# Patient Record
Sex: Female | Born: 1955 | Marital: Married
Health system: Western US, Academic
[De-identification: ages and names within clinical notes are randomized; demographics above are authoritative.]

---

## 2006-08-11 ENCOUNTER — Ambulatory Visit (HOSPITAL_BASED_OUTPATIENT_CLINIC_OR_DEPARTMENT_OTHER): Payer: Self-pay

## 2010-09-19 NOTE — Procedures (Signed)
REPORT TYPE: Outpatient Electroretinogram (ERG)    EP# [id number]    [facility] NAME: RN    Dictating Physician: Sedalia Muta. Genice Rouge, M.D.    Requesting Provider: Daine Gip, M.D.    Study Date: 08/11/2006     ELECTRORETINOGRAM    HISTORY: This is a 54 year old female with progressive scotopic vision loss  over the past 6-7 months.    Visual Acuity: Left eye: 20/25, Right eye: 20/25    A Nicolet Bravo was employed in conjunction with corneal electrodes  referenced to FpZ. Testing was performed with the pupils dilated. A  [place]anzfield bowl was employed.    REPORT:  Right eye: Absent responses under photopic and scotopic conditions.    Left eye: Absent responses under photopic and scotopic conditions.    CLINICAL INTERPRETATION: The electroretinography is extinguished in both  eyes.    COMMENTS: An extinguished ERG is classically seen in the generalized  degenerations of the retina. Such studies can also be seen with ophthalmic  artery occlusion, chorioretinitis metallosis, retinal detachment and drug  toxicity. Clinical correlation is required.    Sedalia Muta Genice Rouge, M.D., Ph.D./Professor of Neurosciences          Signature Derived From Controlled Access Password  Sedalia Muta. Genice Rouge, M.D. 08/25/2006 12:36 P          DD: 08/12/2006 DT: 08/13/2006 12:08 P DocNo.: [id number]  EST/txt      cc: Daine Gip   Fax Recipient   [address]   [place] [address]

## 2019-12-22 NOTE — Progress Notes (Signed)
I reviewed Holly Dyer today on 12/25/2019 who is a 64 year old year old female patient. With Spanish translation provided by certified staff.    HPI     Referred by Dr. Grier Rocher    Presenting complaint:  Night vision/Dark problems and Peripheral field constriction    History of Presenting complaint:  Noticed bumping into items when she was younger.  When wearing sunglasses had a problem with seeing.    Will have cataract surgery on the left eye in February.    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 Does cooking, cleaning but not shopping. Can not travel outside the house. Interested in white stick training.Reading well.       Having cataract surgery OS in February   H/O RP OU 2007  DM2 2013 A1C  121    Last edited by Elberta Fortis, MD on 12/25/2019  2:53 PM. (History)        Visual Acuity (ETDRS)       Right Left    Dist sc 20/63 20/63        Tonometry (i[facility], 1:18 PM)       Right Left    Pressure 10 9        Main Ophthalmology Exam     External Exam       Right Left    External Normal Normal          Slit Lamp Exam       Right Left    Lids/Lashes Normal for Age Normal for Age    Conjunctiva/Sclera White and quiet White and quiet    Cornea Clear Clear    Anterior Chamber Deep and quiet Deep and quiet    Iris Round, no rubeosis Round, no rubeosis    Lens Clear Clear    Vitreous Normal Normal          Fundus Exam       Right Left    Disc Healthy contour and size Healthy contour and size    Macula Normal contour and reflex for age Normal contour and reflex for age    Vessels Normal Normal    Periphery Flat and  attached 360 degrees Flat and attached 360 degrees              OCT, Retina - OU - Both Eyes -[place]           Interpretation  Right Eye  The finding is: Normal without fluid. Findings include PVD.     Left Eye  The finding is: Normal without fluid. Findings include PVD.     Interval Change  Right Eye  Interval change is: Initial.     Left Eye  Interval change is: Initial.     Plan  Right Eye  Plan: Observe.     Left Eye  Plan: Observe.     Notes  Outer retinal loss outside central macula. No cystoid macular edema  .             Fundus Photos - OU - Both  Eyes -Optos           Color Fundus Interpretation  Right Eye  Color progression has not been established as this is the first image. Disc findings include normal observations. Vessel findings include attenuated vessels. Periphery findings include bone spicules.     Left Eye  Color progression has not been established as this is the first image. Disc findings include normal observations. Vessel findings include normal observations. Periphery findings include bone spicules.     Autofluorescence Interpretation  Right Eye  Findings include Hypoautofluorescence.     Left Eye  Findings include Hypoautofluorescence.     Notes  Unless otherwise specified above, the interpretation can be found in today's or the subsequent clinic encounter note. For disc photos, the images were acquired to establish a baseline for future examinations.                 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  2. Cataracts bilaterally - for operation soon, to watch for post operative cystoid macular edema.    Follow up: 3 months for review of genetic testing and octopus visual field      If you have any questions, please do not hesitate to contact me.      Sincerely,      Judd Gaudier  MBBS MRCP([place]) Kathleen Argue PhD  Assistant Professor  Retinal Division [place]     12/25/2019 2:49 PM

## 2019-12-25 ENCOUNTER — Encounter (INDEPENDENT_AMBULATORY_CARE_PROVIDER_SITE_OTHER): Payer: Self-pay

## 2019-12-25 ENCOUNTER — Ambulatory Visit (INDEPENDENT_AMBULATORY_CARE_PROVIDER_SITE_OTHER): Payer: 141

## 2019-12-25 DIAGNOSIS — H3552 Pigmentary retinal dystrophy: Secondary | ICD-10-CM

## 2019-12-25 DIAGNOSIS — Z135 Encounter for screening for eye and ear disorders: Secondary | ICD-10-CM

## 2019-12-25 DIAGNOSIS — H269 Unspecified cataract: Secondary | ICD-10-CM

## 2019-12-25 MED ORDER — METFORMIN HCL 1000 MG OR TABS: 1000.0000 mg | ORAL_TABLET | Freq: Two times a day (BID) | ORAL | Status: AC

## 2019-12-25 MED ORDER — GLIPIZIDE 10 MG OR TABS: 10.0000 mg | ORAL_TABLET | Freq: Two times a day (BID) | ORAL | Status: AC

## 2020-01-08 ENCOUNTER — Encounter (INDEPENDENT_AMBULATORY_CARE_PROVIDER_SITE_OTHER): Payer: Self-pay

## 2020-03-26 ENCOUNTER — Ambulatory Visit (INDEPENDENT_AMBULATORY_CARE_PROVIDER_SITE_OTHER): Payer: No Typology Code available for payment source | Admitting: Cornea and External Diseases Specialist

## 2020-04-09 ENCOUNTER — Ambulatory Visit (INDEPENDENT_AMBULATORY_CARE_PROVIDER_SITE_OTHER): Payer: 141 | Admitting: Cornea and External Diseases Specialist

## 2020-04-09 DIAGNOSIS — H3552 Pigmentary retinal dystrophy: Secondary | ICD-10-CM

## 2020-04-09 DIAGNOSIS — H35353 Cystoid macular degeneration, bilateral: Secondary | ICD-10-CM

## 2020-04-09 DIAGNOSIS — Z135 Encounter for screening for eye and ear disorders: Secondary | ICD-10-CM

## 2020-04-09 NOTE — Progress Notes (Signed)
I reviewed Holly Dyer today on 04/09/2020 who is a 64 year old year old female patient.    HPI     Spansih nterpreter ID: [id number]    Patient notices no change in vision    S/p CEIOL OD (03/26/20)    OS (01/23/2020)     VA is getting better per pt.    Gtts:     Prednisolone drops 4 times a day right eye   Diclofenac 4 times a day right eye    Last edited by Laverna Peace, MD on 04/09/2020 12:58 PM. (History)        Visual Acuity (ETDRS)       Right Left    Dist sc 20/40-2 20/40-1    Dist ph sc Ni 20/40        Tonometry ([facility], 11:55 AM)       Right Left    Pressure 11 10        Main Ophthalmology Exam     External Exam       Right Left    External Normal Normal          Slit Lamp Exam       Right Left    Lids/Lashes Normal for Age Normal for Age    Conjunctiva/Sclera White and quiet White and quiet    Cornea Clear Clear    Anterior Chamber Deep and quiet Deep and quiet    Iris Round, no rubeosis Round, no rubeosis    Lens Posterior chamber intraocular lens Posterior chamber intraocular lens    Vitreous Normal Normal          Fundus Exam       Right Left    Disc Healthy contour and size Healthy contour and size    Macula Normal contour and reflex for age Normal contour and reflex for age    Vessels Normal Normal    Periphery Bone spicule pigmentation Bone spicule pigmentation              Automated Visual Field, Extended - OU - Both Eyes -           Right Eye  Threshold was G. Reliability was good.     Left Eye  Threshold was G. Reliability was good.     Notes  Bilateral very constricted visual fields.               Assessment/Plan  1. Retinitis pigmentosa with trace cystoid macular edema - continue drops for 4 weeks right eye, continue to observe the left eye.Genetic counseling  2. Recent bilateral cataract surgery - continue drops and keepo folow up with cataract surgeon    Follow up: 3 months with microperimetry    If you have any questions, please do not hesitate to contact me.       Sincerely,      Judd Gaudier  MBBS MRCP([place]) Kathleen Argue PhD  Assistant Professor  Retinal Division [place]     04/09/2020 1:10 PM

## 2020-07-09 ENCOUNTER — Ambulatory Visit (INDEPENDENT_AMBULATORY_CARE_PROVIDER_SITE_OTHER): Payer: 141 | Admitting: Cornea and External Diseases Specialist

## 2020-07-09 DIAGNOSIS — H35353 Cystoid macular degeneration, bilateral: Secondary | ICD-10-CM

## 2020-07-09 DIAGNOSIS — H3552 Pigmentary retinal dystrophy: Secondary | ICD-10-CM

## 2020-07-09 NOTE — Progress Notes (Addendum)
I reviewed Holly Dyer today on 07/09/2020 who is a 64 year old year old female patient.    HPI     Eye Exam     In both eyes.  Onset was unknown.              Comments     Patient reports doing well and no change in vision    POH: Retinitis pigmentosa, PC IOL bilaterally     Meds:  Tears prn ou              Last edited by Laverna Peace, MD on 07/09/2020 11:12 AM. (History)        Visual Acuity (Snellen - Linear)       Right Left    Dist sc 20/32 -1 20/32 -1        Tonometry ([facility], 10:08 AM)       Right Left    Pressure 9 10        Main Ophthalmology Exam     External Exam       Right Left    External Normal Normal          Slit Lamp Exam       Right Left    Lids/Lashes Normal for Age Normal for Age    Conjunctiva/Sclera White and quiet White and quiet    Cornea Clear Clear    Anterior Chamber Deep and quiet Deep and quiet    Iris Round, no rubeosis Round, no rubeosis    Lens Posterior chamber intraocular lens Posterior chamber intraocular lens    Vitreous Normal Normal          Fundus Exam       Right Left    Disc Healthy contour and size Healthy contour and size    Macula Normal contour and reflex for age Normal contour and reflex for age    Vessels Normal Normal    Periphery Bone spicule pigmentation Bone spicule pigmentation              OCT, Retina - OU - Both Eyes -           Interpretation  Right Eye  The finding is: Normal without fluid. Findings include PVD. Macular Edema.     Left Eye  The finding is: Normal without fluid. Findings include PVD. Macular Edema.     Interval Change  Right Eye  Interval change is: Stable.     Left Eye  Interval change is: Stable.     Plan  Right Eye  Plan: Observe.     Left Eye  Plan: Observe.     Notes  Unless otherwise specified above, the interpretation can be found in todays or the subsequent clinic encounter note. For disc photos, the images were acquired to establish a baseline for future examinations.             Fundus Photos - OU - Both Eyes -Optos            Color Fundus Interpretation  Right Eye  Disc findings include normal observations. Vessel findings include attenuated vessels. Periphery findings include bone spicules.     Left Eye  Disc findings include normal observations. Vessel findings include normal observations. Periphery findings include bone spicules.     Autofluorescence Interpretation  Right Eye  Findings include Hypoautofluorescence.     Left Eye  Findings include Hypoautofluorescence.     Notes  Unless otherwise specified above, the interpretation can be  found in todays or the subsequent clinic encounter note. For disc photos, the images were acquired to establish a baseline for future examinations.               Microperimetry : Bilateral loss of sensitivity in the peripheral macula    Assessment/Plan  1. RETINITIS PIGMENTOSA  With cystoid macular edema non center involving - help with transport and [facility] today as limited to house at present, stable cystoid macular edema, microperimetry today as a baseline for progression    Follow up: 6 months    If you have any questions, please do not hesitate to contact me.      Sincerely,      Judd Gaudier  MBBS MRCP([place]) Kathleen Argue PhD  Assistant Professor  Retinal Division [place]     07/09/2020 11:13 AM

## 2020-07-09 NOTE — Addendum Note (Signed)
Addended by: Kara Dies, Nechelle Petrizzo on: 07/09/2020 11:20 AM     Modules accepted: Orders

## 2020-07-09 NOTE — Addendum Note (Signed)
Addended by: Glean Salvo on: 07/09/2020 11:56 AM     Modules accepted: Orders

## 2021-01-07 ENCOUNTER — Ambulatory Visit (INDEPENDENT_AMBULATORY_CARE_PROVIDER_SITE_OTHER): Payer: 141 | Admitting: Cornea and External Diseases Specialist

## 2021-01-07 DIAGNOSIS — H3552 Pigmentary retinal dystrophy: Secondary | ICD-10-CM

## 2021-01-07 DIAGNOSIS — H04123 Dry eye syndrome of bilateral lacrimal glands: Secondary | ICD-10-CM

## 2021-01-07 NOTE — Progress Notes (Signed)
I reviewed Holly Dyer today on 07/09/2020 who is a 65 year old year old female patient.    HPI     Change to ocular history:  No change in her night vision or peripheral field     Smoking History: non-smoker    Driving history: No    Updates to Occupational/Educational needs: no she no help with her daily activities     Updates to Social assistance and needs: no           Last edited by Jimmy Footman on 01/07/2021 11:59 AM. (History)        Visual Acuity (ETDRS)       Right Left    Dist sc 20/25 -2 20/25 -2        Tonometry ([facility], 11:05 AM)       Right Left    Pressure 10 10        Main Ophthalmology Exam     External Exam       Right Left    External Normal Normal          Slit Lamp Exam       Right Left    Lids/Lashes Normal for Age Normal for Age    Conjunctiva/Sclera White and quiet White and quiet    Cornea Clear Clear    Anterior Chamber Deep and quiet Deep and quiet    Iris Round, no rubeosis Round, no rubeosis    Lens Posterior chamber intraocular lens Posterior chamber intraocular lens    Vitreous Normal Normal          Fundus Exam       Right Left    Disc Healthy contour and size Healthy contour and size    Macula Normal contour and reflex for age Normal contour and reflex for age    Vessels Normal Normal    Periphery Bone spicule pigmentation Bone spicule pigmentation              OCT, Retina - OU - Both Eyes -           Interpretation  Right Eye  The finding is: Normal without fluid. Findings include PVD. Negative for Macular Edema.     Left Eye  The finding is: Normal without fluid. Findings include PVD. Negative for Macular Edema.     Interval Change  Right Eye  Interval change is: Better.     Left Eye  Interval change is: Better.     Plan  Right Eye  Plan: Observe.     Left Eye  Plan: Observe.            Fundus Photos - OU - Both Eyes -           Color Fundus Interpretation  Right Eye  Disc findings include normal observations. Vessel findings include attenuated vessels. Periphery findings  include bone spicules.     Left Eye  Disc findings include normal observations. Vessel findings include normal observations. Periphery findings include bone spicules.     Autofluorescence Interpretation  Right Eye  Findings include Hypoautofluorescence.     Left Eye  Findings include Hypoautofluorescence.              Microperimetry : Bilateral loss of sensitivity in the peripheral macula    Assessment/Plan  1. RETINITIS PIGMENTOSA  - resolved cystoid macular edema, no requirements for social assistance at present according to patient, although do notice poor mobility in clinic and may be a candidate  for stick training  2. Dry eyes - continue artifical tears    Follow up: 12 months    If you have any questions, please do not hesitate to contact me.      Sincerely,      Judd Gaudier  MBBS MRCP([place]) MRCSEd FRCOphth PhD  Assistant Professor  Retinal Division [place]

## 2023-05-04 IMAGING — CT Tc [place] ([place], [place]
1 of 2 series · 14 of 32 positions shown, 20 images · non-contrast
Comparison: none

[Series 3: vol pm s/c · axial · 0.56mm/px · z∈[-26,+205]mm · 14 of 429 slices shown, 20 images]
[im 29/429  soft-tissue]
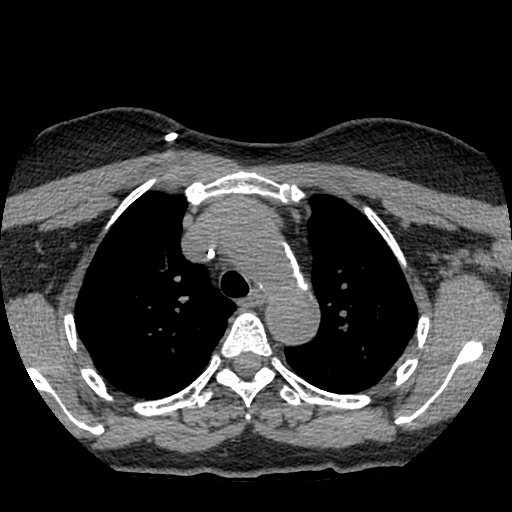
[im 29/429  bone]
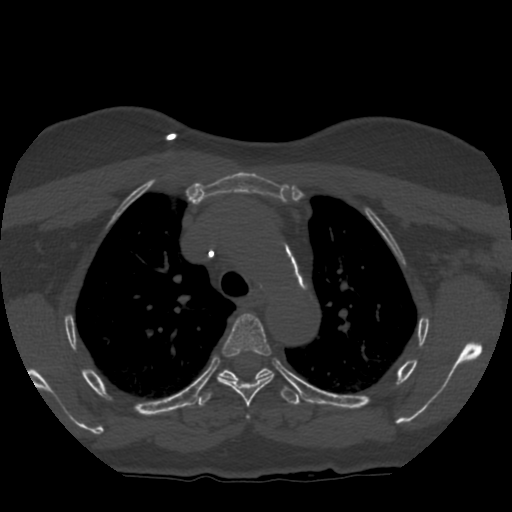
[im 58/429  soft-tissue]
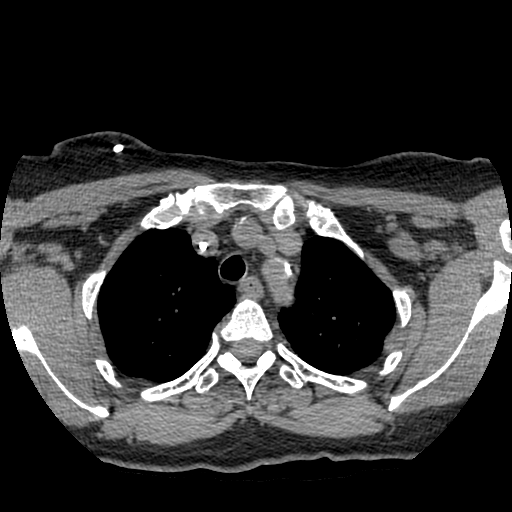
[im 86/429  soft-tissue]
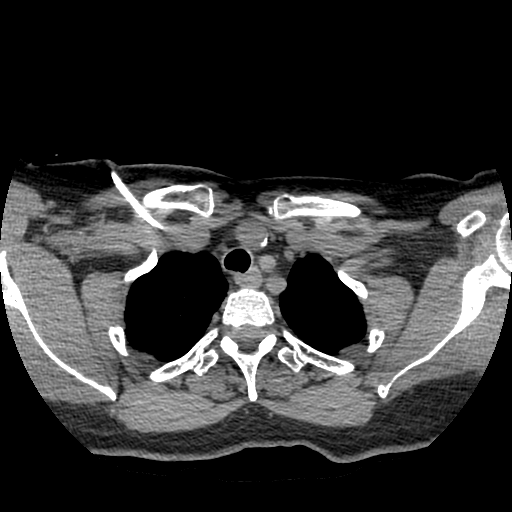
[im 115/429  soft-tissue]
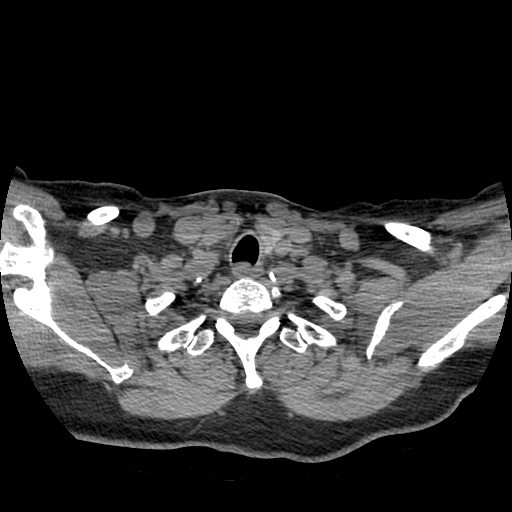
[im 143/429  soft-tissue]
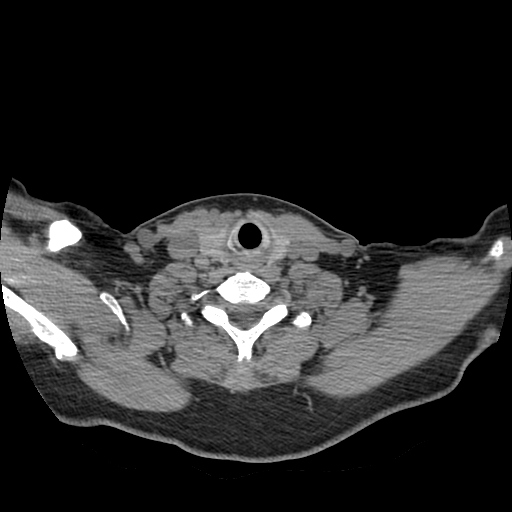
[im 172/429  soft-tissue]
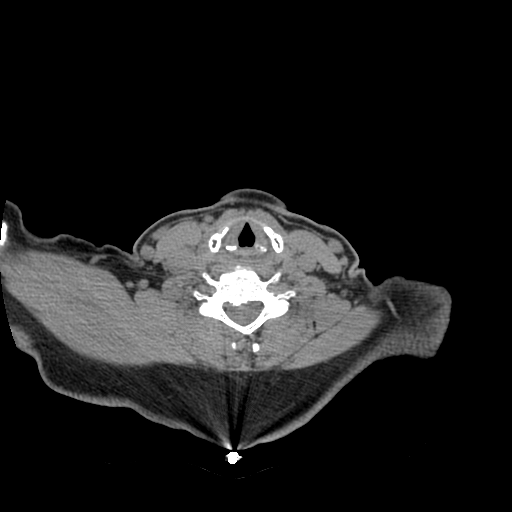
[im 200/429  soft-tissue]
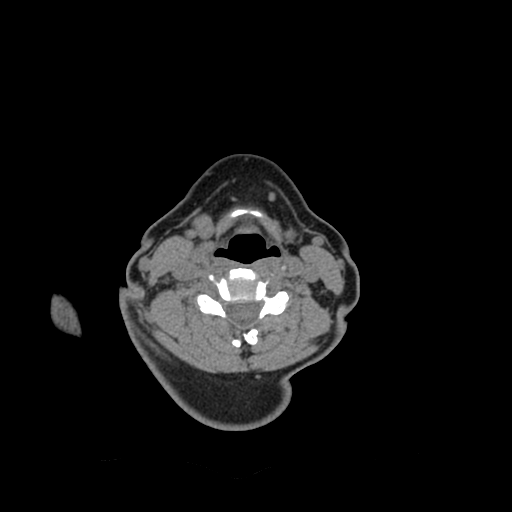
[im 229/429  soft-tissue]
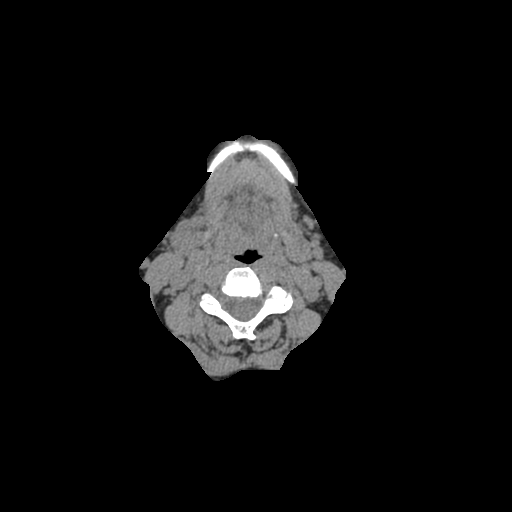
[im 257/429  soft-tissue]
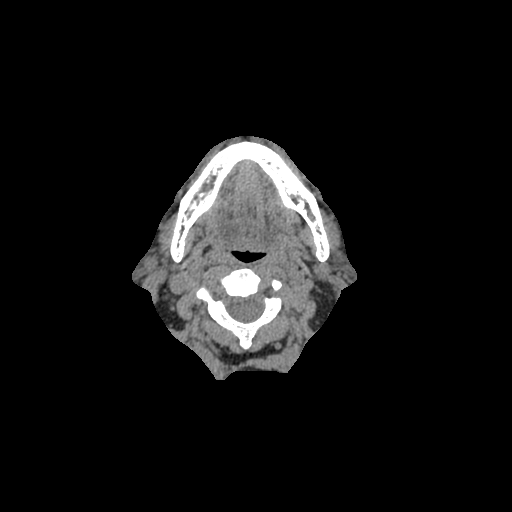
[im 257/429  bone]
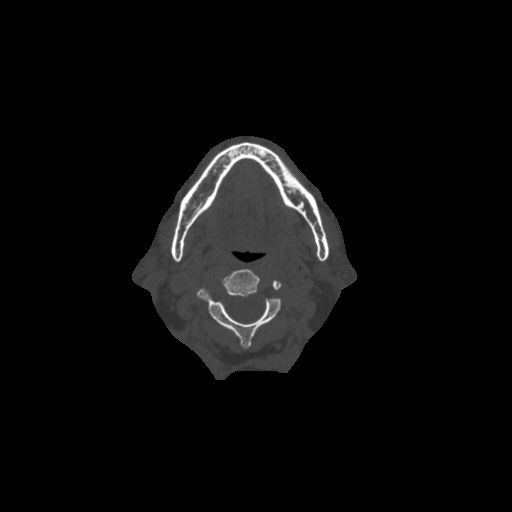
[im 286/429  soft-tissue]
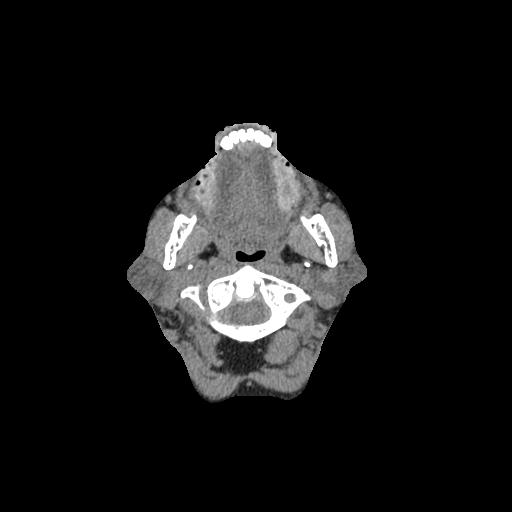
[im 314/429  soft-tissue]
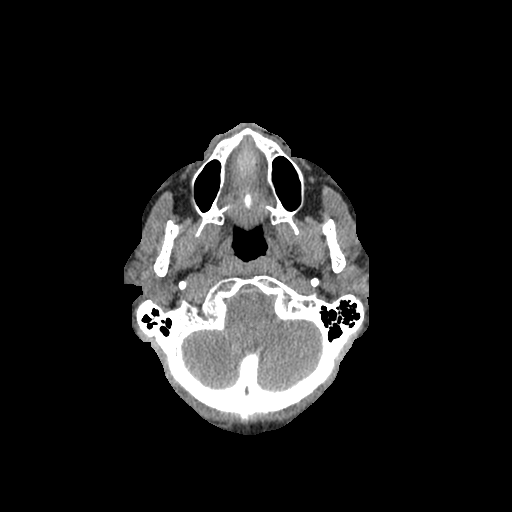
[im 314/429  lung]
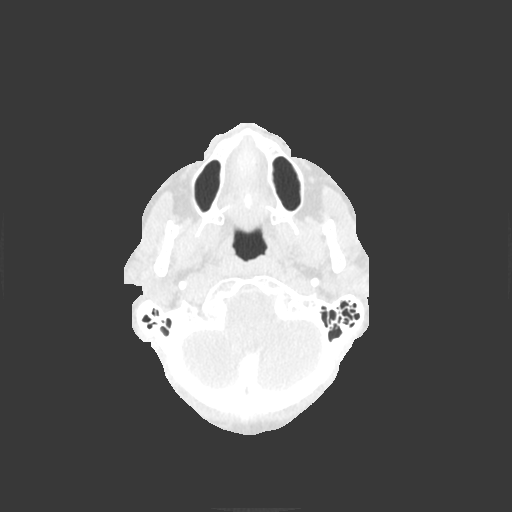
[im 343/429  soft-tissue]
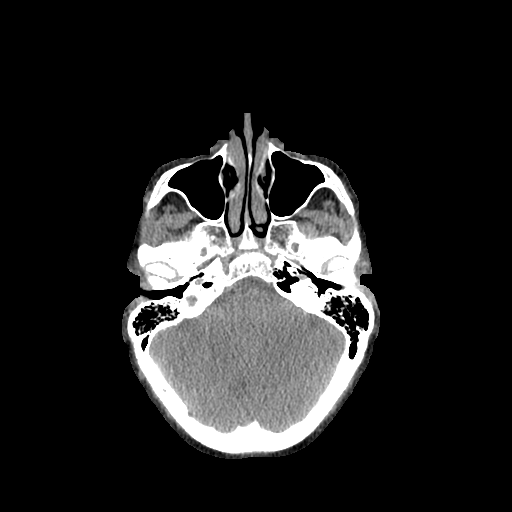
[im 343/429  lung]
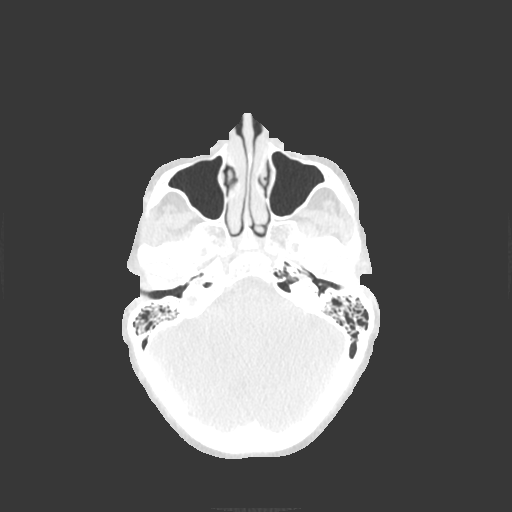
[im 371/429  soft-tissue]
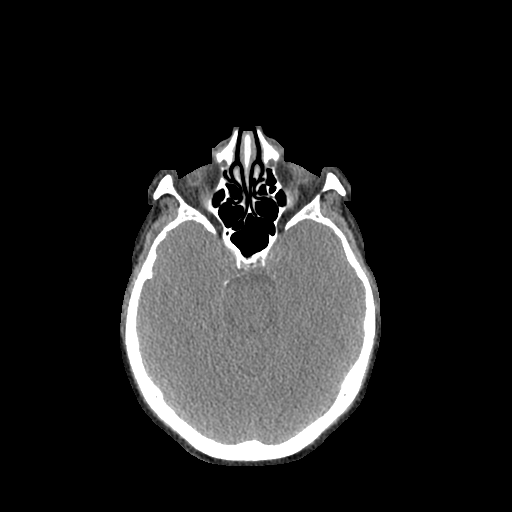
[im 371/429  lung]
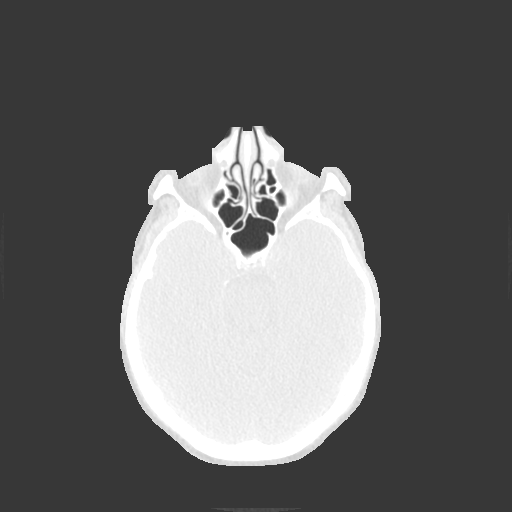
[im 400/429  soft-tissue]
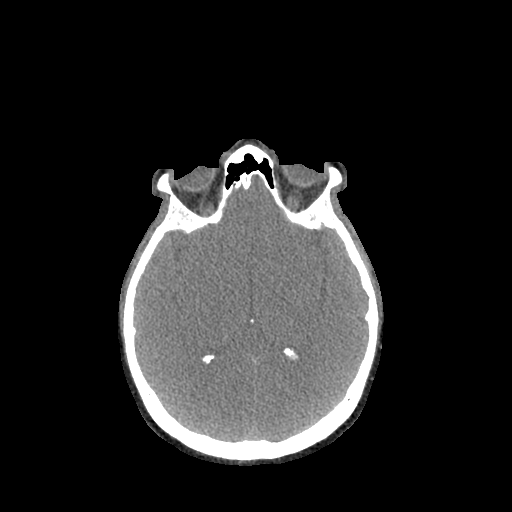
[im 400/429  lung]
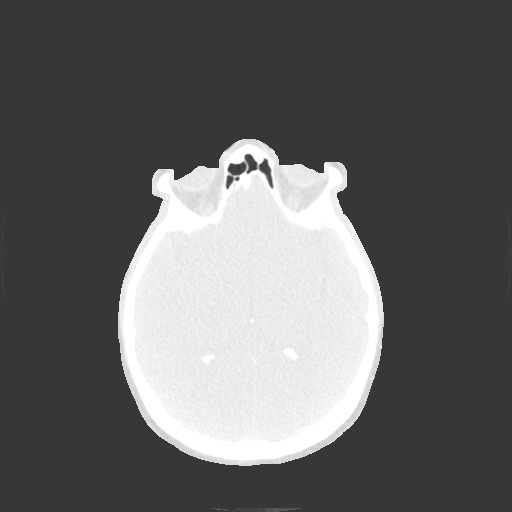

[14 of 32 positions shown; findings below may reference images not displayed]

Técnica:
Aquisição volumétrica dos dados em equipamento multislice, com posteriores reformatações multiplanares.
Administrado contraste endovenoso.
Relatório:
Logo Clin[facility]
Nasofaringe, orofaringe e hipofaringe com morfologia e coeficientes de atenuação normais.
Espaços parafaríngeos e fossas pterigopalatinas sem alterações.
Glândulas parótidas e submandibulares com forma, contornos, dimensões e atenuação preservados.
TOMOGRAFIA COMPUTADORIZADA DO PESCOÇO
Glândula tireoide com forma, contornos, dimensões e atenuação preservados.
Não há evidência de linfonodomegalia cervical.
Planos musculares e adiposos preservados.
Impressão:
Exame dentro dos limites da normalidade.
[facility] [id number] [phone], [place] - [place]

## 2024-08-15 IMAGING — MR NEURO [facility] [place]AN[place]
8 of 11 series · 29 of 48 positions shown · non-contrast
Comparison: none

[Series 1: localizador_haste · axial · 7.0mm · 0.57mm/px · 1 of 9 slices shown]
[im 1/9]
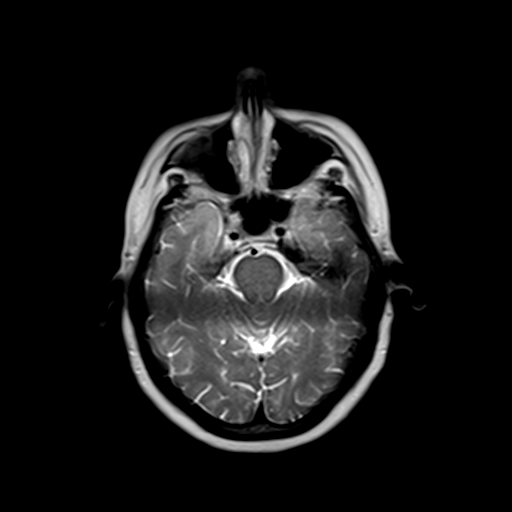

[Series 2: FLAIR · sagittal · 5.5mm · 0.86mm/px · 4 of 20 slices shown (1 of 3)]
[im 1/20]
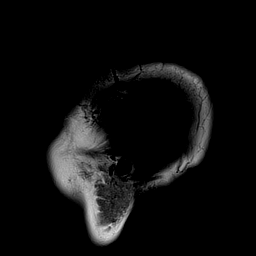
[im 7/20]
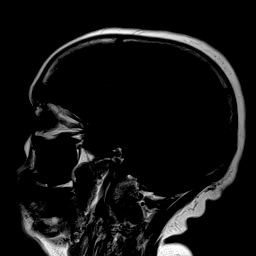
[im 13/20]
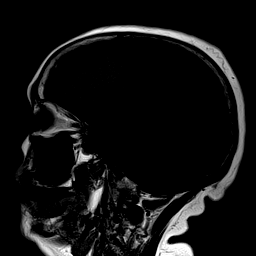
[im 20/20]
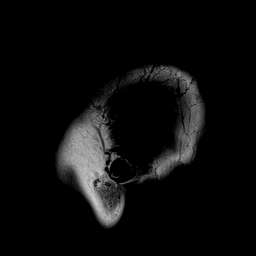

[Series 3: T2 · axial · 5.0mm · 0.72mm/px · z∈[-20,+102]mm · 4 of 20 slices shown (1 of 2)]
[im 1/20]
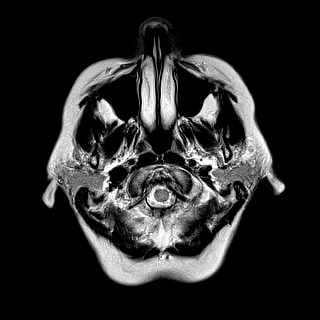
[im 7/20]
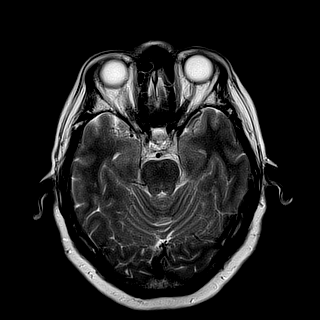
[im 13/20]
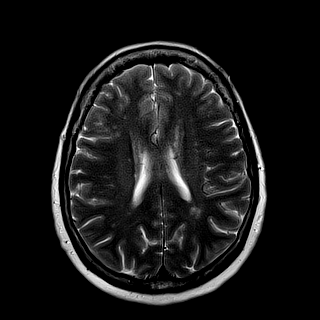
[im 20/20]
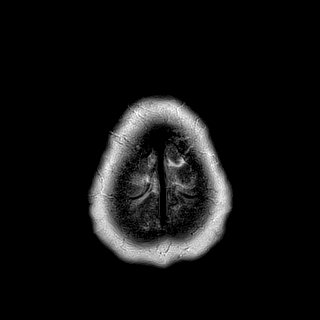

[Series 4: FLAIR · axial · 5.0mm · 0.90mm/px · z∈[-20,+102]mm · 4 of 20 slices shown (2 of 3)]
[im 1/20]
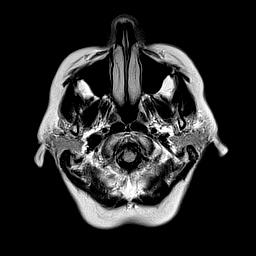
[im 7/20]
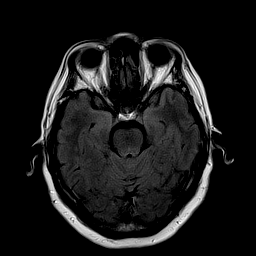
[im 13/20]
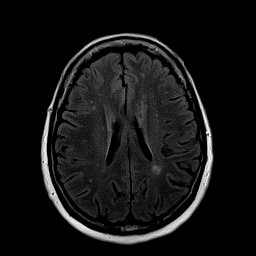
[im 20/20]
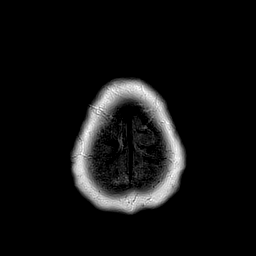

[Series 5: t2_tse_fs_cor · coronal · 6.0mm · 0.43mm/px · 1 of 20 slices shown]
[im 1/20]
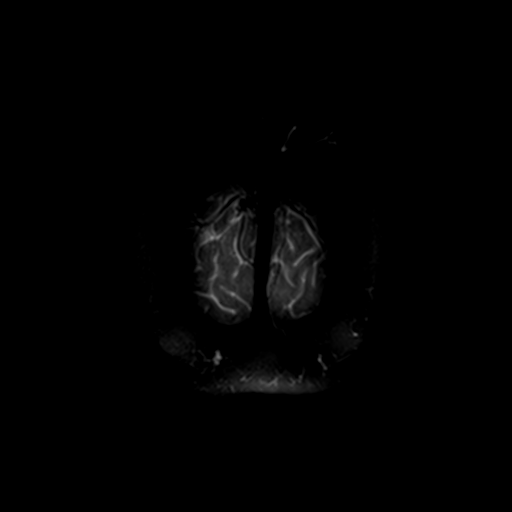

[Series 9: GRE · axial · 5.0mm · 0.36mm/px · z∈[-19,+103]mm · 5 of 20 slices shown]
[im 1/20]
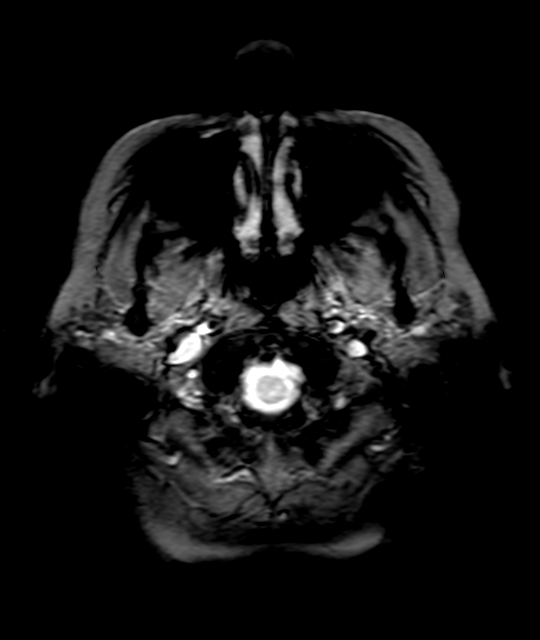
[im 5/20]
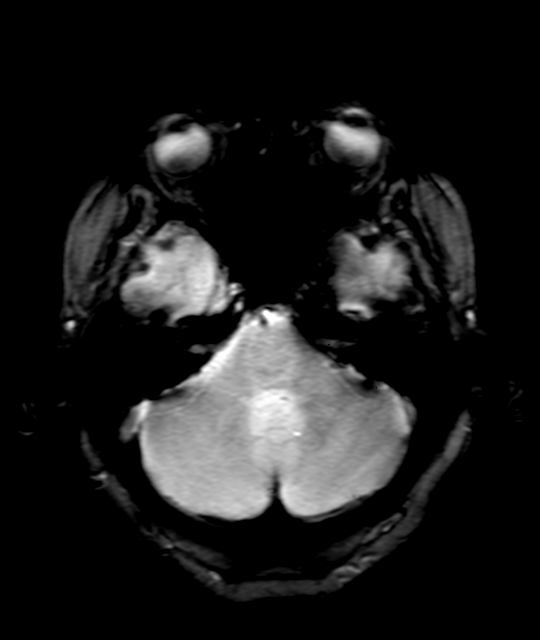
[im 10/20]
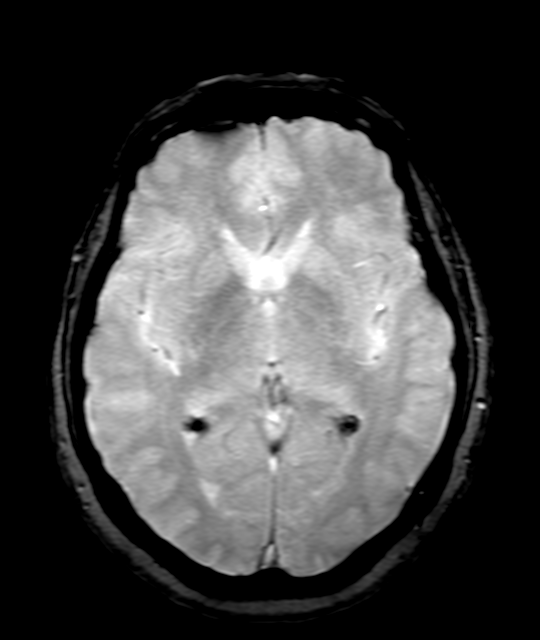
[im 15/20]
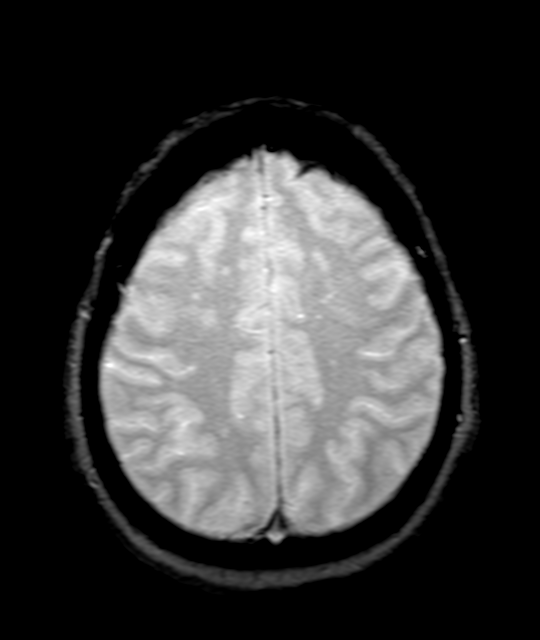
[im 20/20]
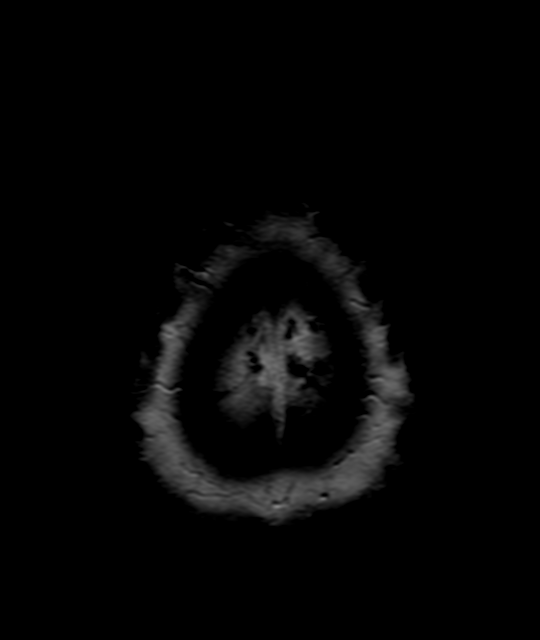

[Series 11: T2 · coronal · 3.0mm · 0.57mm/px · 5 of 20 slices shown (2 of 2)]
[im 1/20]
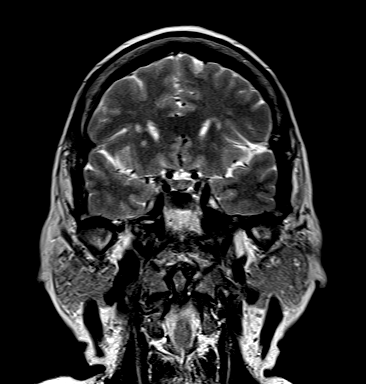
[im 5/20]
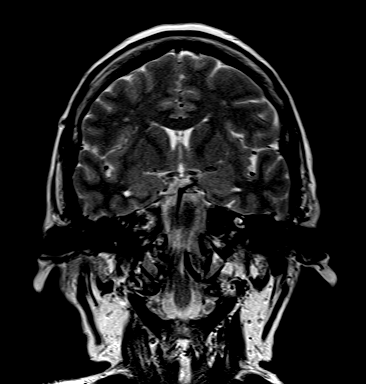
[im 10/20]
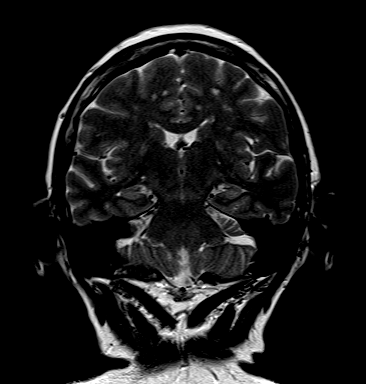
[im 15/20]
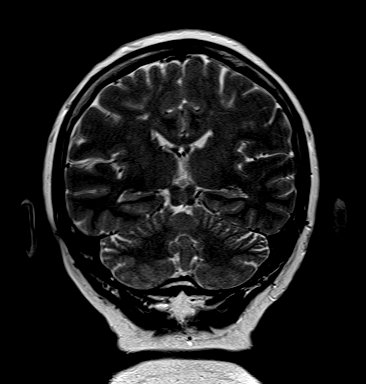
[im 20/20]
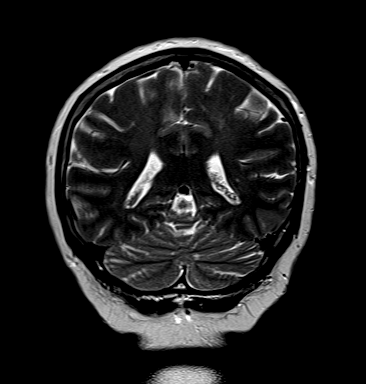

[Series 12: FLAIR · coronal · 3.0mm · 0.43mm/px · 5 of 20 slices shown (3 of 3)]
[im 1/20]
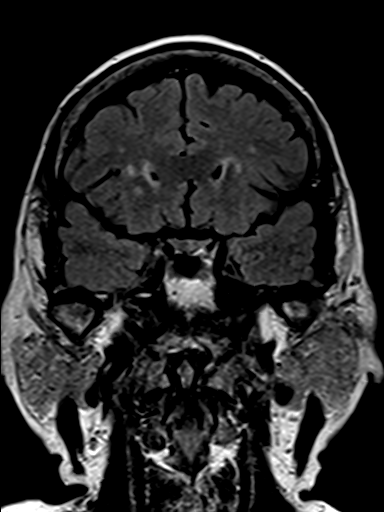
[im 5/20]
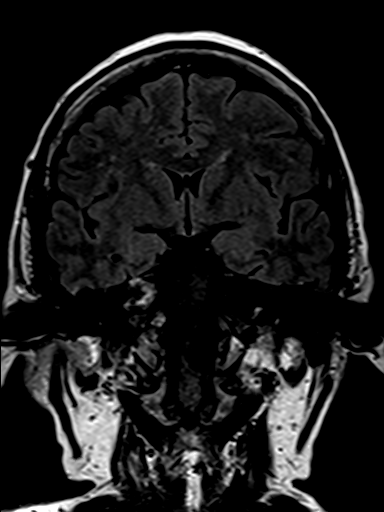
[im 10/20]
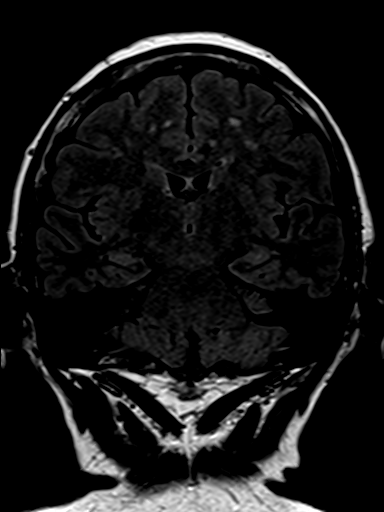
[im 15/20]
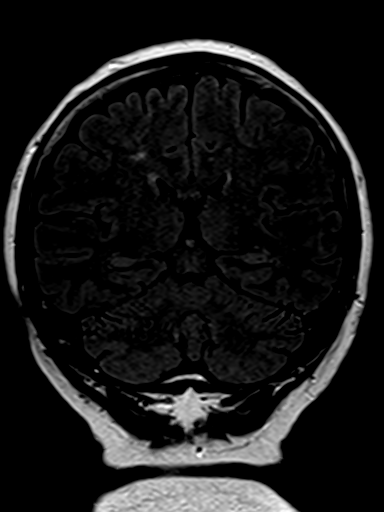
[im 20/20]
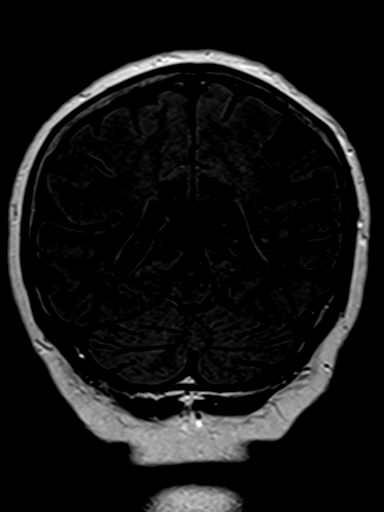

[29 of 48 positions shown; findings below may reference images not displayed]

METODOLOGIA:
Exame realizado com sequências SE (spin-echo), FSE (fast spin-eco), GR (gradiente-eco) e FSE-IR (FLAIR), em planos de cortes
múltiplos, sem a administração intravenosa do agente de contraste paramagnético.
ANÁLISE:
Não há evidência de processo expansivo intracraniano, hemorragia intraparenquimatosa aguda, isquemia aguda/subaguda,
bem como de coleções líquidas extra-axiais acima ou abaixo do tentório.
O sistema ventricular é de topograﬁa, morfologia e dimensões normais.
Focos de hipersinal em T2 distribuídos pela substância branca supratentorial, sem determinar efeito atróﬁco ou expansivo
signiﬁcativo, sugerindo gliose por microangiopatia.
RESSONÂNCIA MAGNÉTICA DO ENCÉFALO
Não se identiﬁcam áreas com restrição à difusão da água na sequência ECOPLANAR.
Formações hipocampais de morfologia, intensidade de sinal e dimensões de aspecto habitual pela análise qualitativa.
Fluxo habitual nas grandes artérias dos sistemas vertebrobasilar e carotídeo, segundo o critério Spin-Echo.
Cavidades aéreas paranasais com intensidade de sinal normal.
IMPRESSÃO:
Sinais de microangiopatia supratentorial.
[facility] - [address] [phone], [place] - [place]
